# Patient Record
Sex: Female | Born: 1940 | Race: White | Hispanic: No | Marital: Married | Smoking: Former smoker
Health system: Southern US, Community
[De-identification: ages and names within clinical notes are randomized; demographics above are authoritative.]

## PROBLEM LIST (undated history)

## (undated) DIAGNOSIS — F32A Depression, unspecified: Secondary | ICD-10-CM

## (undated) DIAGNOSIS — F329 Major depressive disorder, single episode, unspecified: Secondary | ICD-10-CM

## (undated) DIAGNOSIS — E7800 Pure hypercholesterolemia, unspecified: Secondary | ICD-10-CM

## (undated) DIAGNOSIS — I10 Essential (primary) hypertension: Secondary | ICD-10-CM

## (undated) HISTORY — PX: MELANOMA EXCISION: SHX5266

---

## 2013-12-25 ENCOUNTER — Emergency Department (HOSPITAL_COMMUNITY): Payer: Medicare Other

## 2013-12-25 ENCOUNTER — Encounter (HOSPITAL_COMMUNITY): Payer: Self-pay | Admitting: Emergency Medicine

## 2013-12-25 ENCOUNTER — Emergency Department (HOSPITAL_COMMUNITY)
Admission: EM | Admit: 2013-12-25 | Discharge: 2013-12-25 | Disposition: A | Discharge status: Home / Self Care | Payer: Medicare Other | Attending: Emergency Medicine | Admitting: Emergency Medicine

## 2013-12-25 DIAGNOSIS — F3289 Other specified depressive episodes: Secondary | ICD-10-CM | POA: Insufficient documentation

## 2013-12-25 DIAGNOSIS — S0093XA Contusion of unspecified part of head, initial encounter: Secondary | ICD-10-CM

## 2013-12-25 DIAGNOSIS — S0083XA Contusion of other part of head, initial encounter: Principal | ICD-10-CM

## 2013-12-25 DIAGNOSIS — S0003XA Contusion of scalp, initial encounter: Secondary | ICD-10-CM | POA: Insufficient documentation

## 2013-12-25 DIAGNOSIS — Y939 Activity, unspecified: Secondary | ICD-10-CM | POA: Insufficient documentation

## 2013-12-25 DIAGNOSIS — Z79899 Other long term (current) drug therapy: Secondary | ICD-10-CM | POA: Insufficient documentation

## 2013-12-25 DIAGNOSIS — S1093XA Contusion of unspecified part of neck, initial encounter: Principal | ICD-10-CM

## 2013-12-25 DIAGNOSIS — IMO0002 Reserved for concepts with insufficient information to code with codable children: Secondary | ICD-10-CM | POA: Insufficient documentation

## 2013-12-25 DIAGNOSIS — I10 Essential (primary) hypertension: Secondary | ICD-10-CM | POA: Insufficient documentation

## 2013-12-25 DIAGNOSIS — W1809XA Striking against other object with subsequent fall, initial encounter: Secondary | ICD-10-CM | POA: Insufficient documentation

## 2013-12-25 DIAGNOSIS — Z87891 Personal history of nicotine dependence: Secondary | ICD-10-CM | POA: Insufficient documentation

## 2013-12-25 DIAGNOSIS — Y929 Unspecified place or not applicable: Secondary | ICD-10-CM | POA: Insufficient documentation

## 2013-12-25 DIAGNOSIS — W19XXXA Unspecified fall, initial encounter: Secondary | ICD-10-CM

## 2013-12-25 DIAGNOSIS — F329 Major depressive disorder, single episode, unspecified: Secondary | ICD-10-CM | POA: Insufficient documentation

## 2013-12-25 DIAGNOSIS — E7800 Pure hypercholesterolemia, unspecified: Secondary | ICD-10-CM | POA: Insufficient documentation

## 2013-12-25 HISTORY — DX: Pure hypercholesterolemia, unspecified: E78.00

## 2013-12-25 HISTORY — DX: Major depressive disorder, single episode, unspecified: F32.9

## 2013-12-25 HISTORY — DX: Depression, unspecified: F32.A

## 2013-12-25 HISTORY — DX: Essential (primary) hypertension: I10

## 2013-12-25 MED ORDER — ACETAMINOPHEN 325 MG PO TABS
650.0000 mg | ORAL_TABLET | Freq: Once | ORAL | Status: AC
  Administered 2013-12-25: 650 mg via ORAL
  Filled 2013-12-25: qty 2
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
## 2013-12-25 NOTE — ED Notes (Signed)
Pt tripped over dog bed, hit R forehead on table while falling.  AO x 4.  Per EMS pt was confused originally, but presently is back to norm.  Dr Anitra LauthPlunkett at bedside.

## 2014-06-09 IMAGING — CT CT HEAD W/O CM
1 of 2 series · 16 of 30 positions shown, 20 images · non-contrast
Comparison: None.

CLINICAL DATA: Tripped hitting right forehead.  Initially confused.

EXAM:
CT HEAD WITHOUT CONTRAST
TECHNIQUE: Contiguous axial images were obtained from the base of the skull
through the vertex without intravenous contrast.

[Series 3: head 2.0 h70h · axial · 0.42mm/px · z∈[-160,-20]mm · 16 of 78 slices shown, 20 images]
[im 4/78  brain]
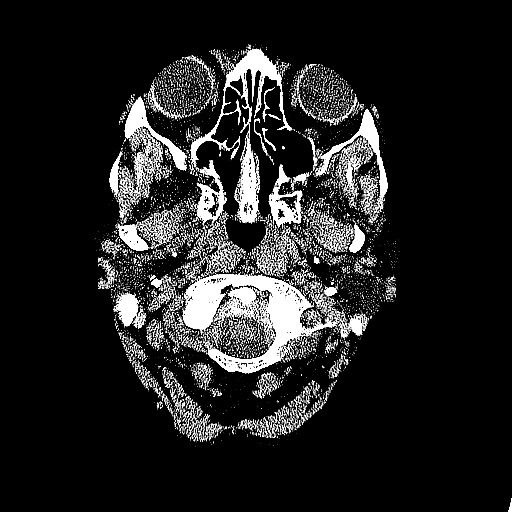
[im 4/78  bone]
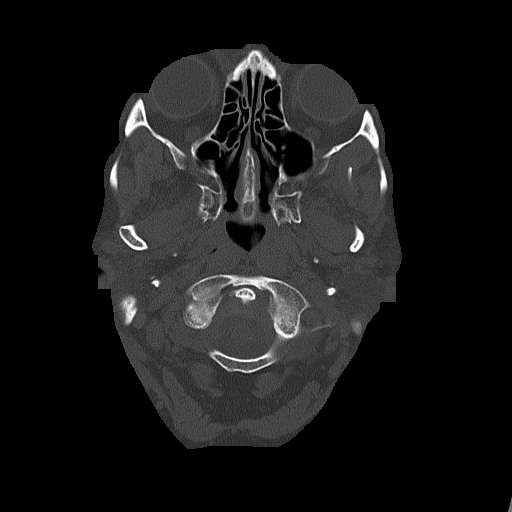
[im 8/78  brain]
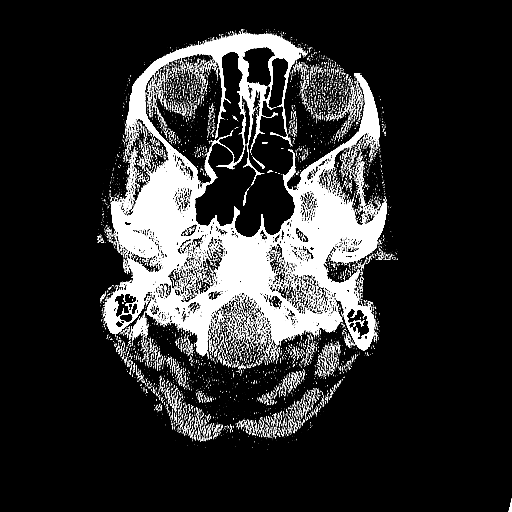
[im 12/78  brain]
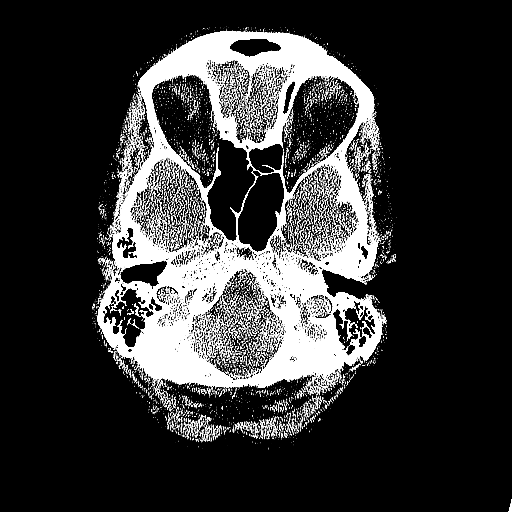
[im 20/78  brain]
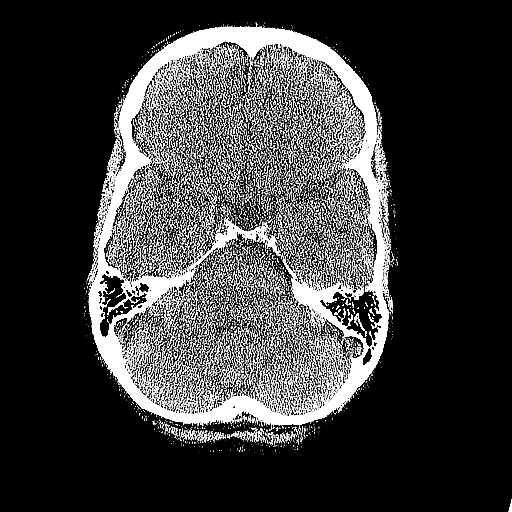
[im 24/78  brain]
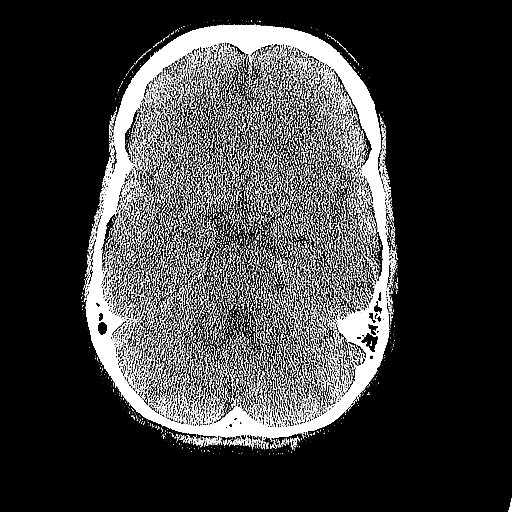
[im 24/78  bone]
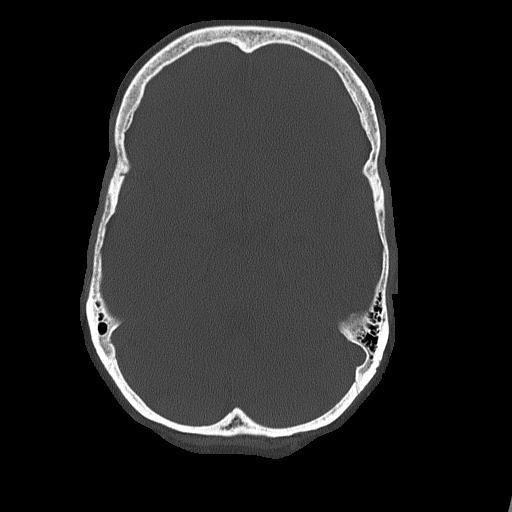
[im 27/78  brain]
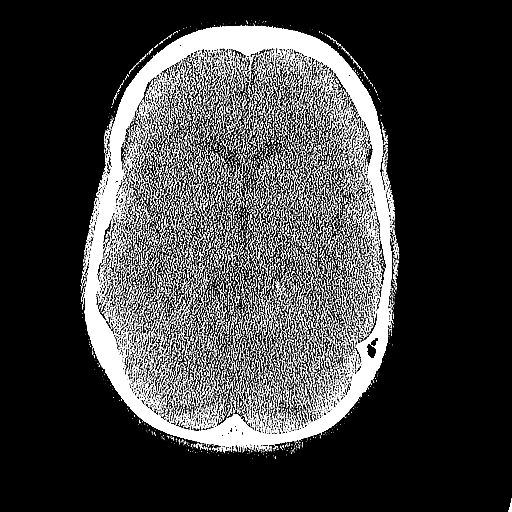
[im 31/78  brain]
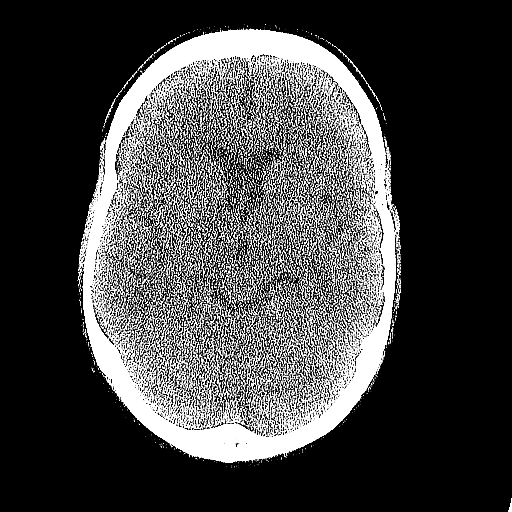
[im 35/78  brain]
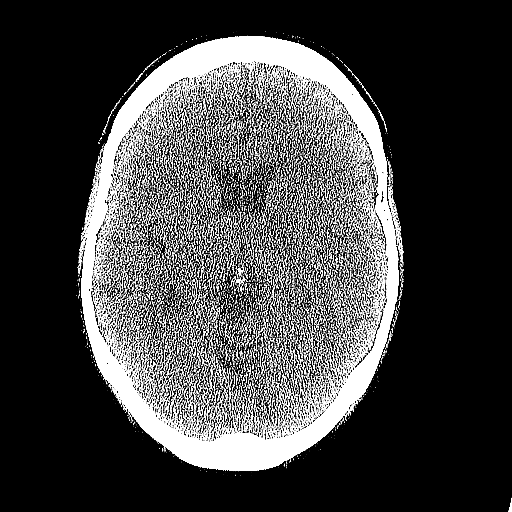
[im 43/78  brain]
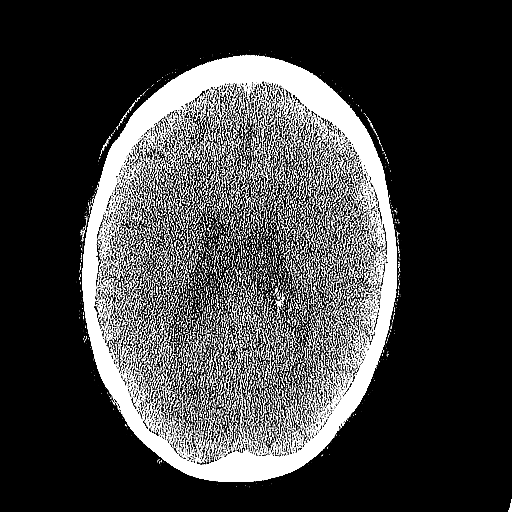
[im 43/78  bone]
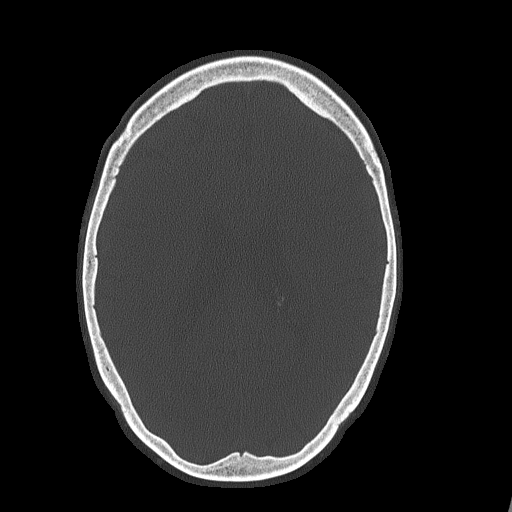
[im 47/78  brain]
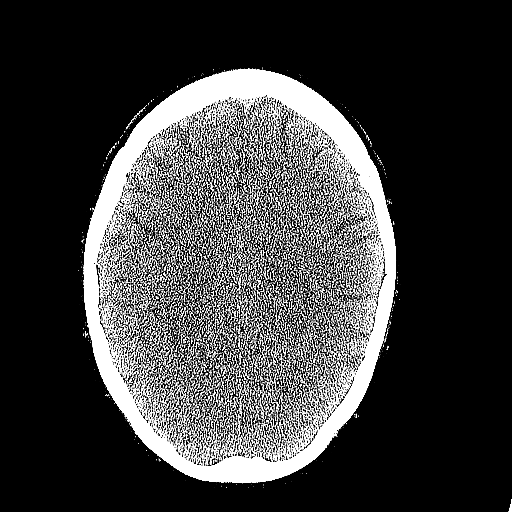
[im 51/78  brain]
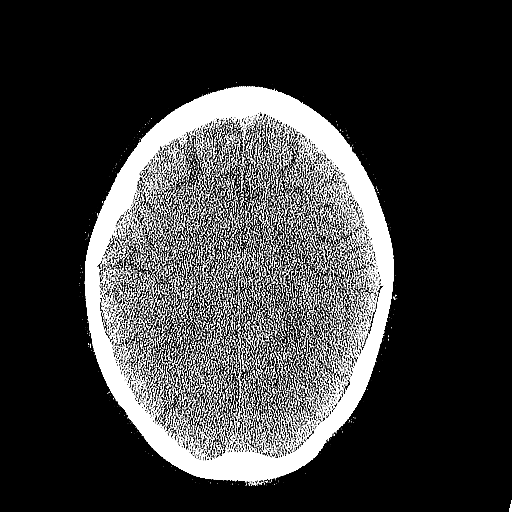
[im 54/78  brain]
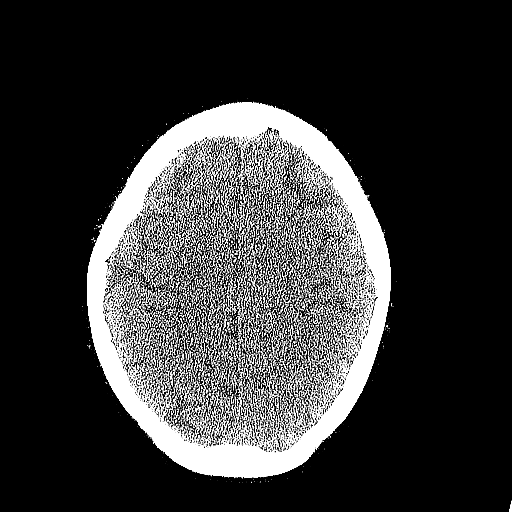
[im 58/78  brain]
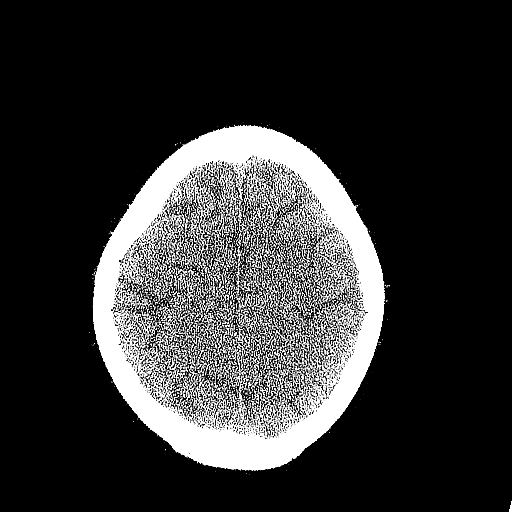
[im 58/78  bone]
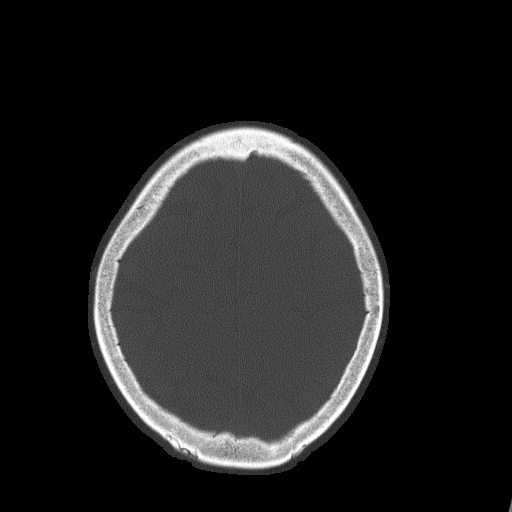
[im 66/78  brain]
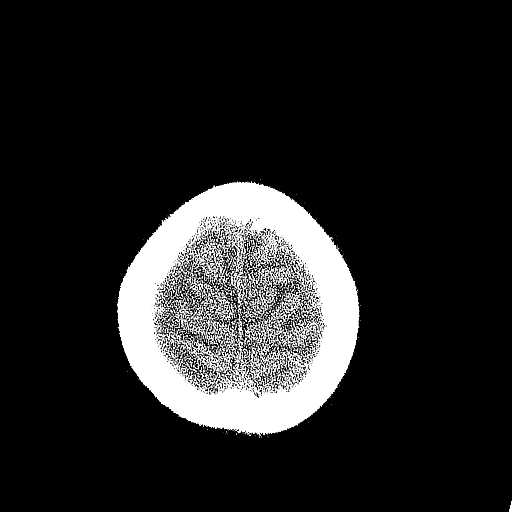
[im 70/78  brain]
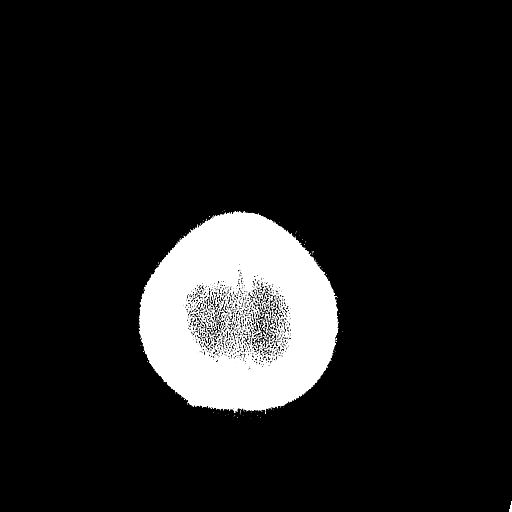
[im 74/78  brain]
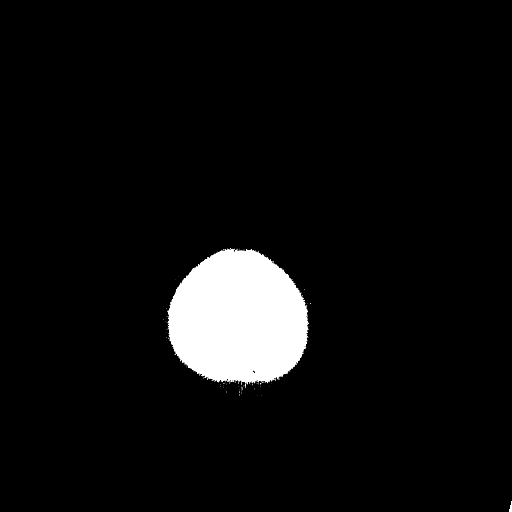

[16 of 30 positions shown; findings below may reference images not displayed]

FINDINGS: Ventricles are normal in size and configuration.

No parenchymal masses or mass effect. No areas of abnormal
parenchymal attenuation. No evidence of a cortical infarct.

There are no extra-axial masses or abnormal fluid collections.

No intracranial hemorrhage.

No skull fracture. Visualized sinuses and mastoid air cells are
clear.
IMPRESSION: Normal unenhanced CT scan of the brain.
